# Patient Record
Sex: Female | Born: 1995 | Race: White | Hispanic: No | Marital: Single | Smoking: Never smoker
Health system: Southern US, Community
[De-identification: ages and names within clinical notes are randomized; demographics above are authoritative.]

## PROBLEM LIST (undated history)

## (undated) DIAGNOSIS — Z789 Other specified health status: Secondary | ICD-10-CM

## (undated) HISTORY — PX: NO PAST SURGERIES: SHX2092

---

## 2014-10-29 ENCOUNTER — Emergency Department: Admit: 2014-10-29 | Disposition: A | Discharge status: Home / Self Care | Payer: Self-pay | Admitting: Student

## 2014-10-29 LAB — URINALYSIS, COMPLETE
BILIRUBIN, UR: NEGATIVE
BLOOD: NEGATIVE
Glucose,UR: NEGATIVE mg/dL (ref 0–75)
Ketone: NEGATIVE
LEUKOCYTE ESTERASE: NEGATIVE
Nitrite: NEGATIVE
Ph: 6 (ref 4.5–8.0)
Protein: NEGATIVE
Specific Gravity: 1.025 (ref 1.003–1.030)

## 2014-10-29 LAB — CBC WITH DIFFERENTIAL/PLATELET
Basophil #: 0 10*3/uL (ref 0.0–0.1)
Basophil %: 0.4 %
EOS PCT: 0.9 %
Eosinophil #: 0.1 10*3/uL (ref 0.0–0.7)
HCT: 39.2 % (ref 35.0–47.0)
HGB: 13.3 g/dL (ref 12.0–16.0)
LYMPHS ABS: 2.7 10*3/uL (ref 1.0–3.6)
Lymphocyte %: 28.6 %
MCH: 29.9 pg (ref 26.0–34.0)
MCHC: 34 g/dL (ref 32.0–36.0)
MCV: 88 fL (ref 80–100)
MONO ABS: 0.5 x10 3/mm (ref 0.2–0.9)
MONOS PCT: 5 %
NEUTROS ABS: 6.1 10*3/uL (ref 1.4–6.5)
Neutrophil %: 65.1 %
Platelet: 233 10*3/uL (ref 150–440)
RBC: 4.46 10*6/uL (ref 3.80–5.20)
RDW: 12.2 % (ref 11.5–14.5)
WBC: 9.3 10*3/uL (ref 3.6–11.0)

## 2014-10-29 LAB — BASIC METABOLIC PANEL
ANION GAP: 5 — AB (ref 7–16)
BUN: 13 mg/dL
CALCIUM: 8.8 mg/dL — AB
CHLORIDE: 106 mmol/L
CREATININE: 0.78 mg/dL
Co2: 26 mmol/L
EGFR (African American): >60
Glucose: 105 mg/dL — ABNORMAL HIGH
Potassium: 3.8 mmol/L
Sodium: 137 mmol/L

## 2014-10-29 LAB — MONONUCLEOSIS SCREEN: Mono Test: NEGATIVE

## 2014-10-31 LAB — BETA STREP CULTURE(ARMC)

## 2014-11-02 ENCOUNTER — Emergency Department
Admit: 2014-11-02 | Disposition: A | Discharge status: Home / Self Care | Payer: Self-pay | Admitting: Emergency Medicine

## 2014-11-02 LAB — URINALYSIS, COMPLETE
BACTERIA: NONE SEEN
BLOOD: NEGATIVE
Bilirubin,UR: NEGATIVE
GLUCOSE, UR: NEGATIVE mg/dL (ref 0–75)
Ketone: NEGATIVE
LEUKOCYTE ESTERASE: NEGATIVE
NITRITE: NEGATIVE
PH: 7 (ref 4.5–8.0)
PROTEIN: NEGATIVE
Specific Gravity: 1.013 (ref 1.003–1.030)

## 2014-11-02 LAB — CBC WITH DIFFERENTIAL/PLATELET
Basophil #: 0 10*3/uL (ref 0.0–0.1)
Basophil %: 0.4 %
EOS PCT: 1.8 %
Eosinophil #: 0.2 10*3/uL (ref 0.0–0.7)
HCT: 36.9 % (ref 35.0–47.0)
HGB: 12.7 g/dL (ref 12.0–16.0)
LYMPHS ABS: 3.3 10*3/uL (ref 1.0–3.6)
Lymphocyte %: 36.7 %
MCH: 29.9 pg (ref 26.0–34.0)
MCHC: 34.3 g/dL (ref 32.0–36.0)
MCV: 87 fL (ref 80–100)
MONO ABS: 0.6 x10 3/mm (ref 0.2–0.9)
Monocyte %: 6.2 %
NEUTROS ABS: 4.9 10*3/uL (ref 1.4–6.5)
Neutrophil %: 54.9 %
PLATELETS: 268 10*3/uL (ref 150–440)
RBC: 4.24 10*6/uL (ref 3.80–5.20)
RDW: 11.8 % (ref 11.5–14.5)
WBC: 9 10*3/uL (ref 3.6–11.0)

## 2014-11-02 LAB — COMPREHENSIVE METABOLIC PANEL
ALBUMIN: 4.2 g/dL
ALK PHOS: 56 U/L
ANION GAP: 10 (ref 7–16)
BUN: 8 mg/dL
Bilirubin,Total: 0.3 mg/dL
CREATININE: 0.72 mg/dL
Calcium, Total: 9.5 mg/dL
Chloride: 109 mmol/L
Co2: 22 mmol/L
EGFR (African American): >60
EGFR (Non-African Amer.): >60
Glucose: 90 mg/dL
POTASSIUM: 3.6 mmol/L
SGOT(AST): 31 U/L
SGPT (ALT): 12 U/L — ABNORMAL LOW
SODIUM: 141 mmol/L
Total Protein: 7.5 g/dL

## 2014-11-04 LAB — CULTURE, BLOOD (SINGLE)

## 2015-10-02 ENCOUNTER — Inpatient Hospital Stay
Admission: EM | Admit: 2015-10-02 | Discharge: 2015-10-05 | DRG: 866 | Disposition: A | Discharge status: Home / Self Care | Payer: BLUE CROSS/BLUE SHIELD | Attending: Internal Medicine | Admitting: Internal Medicine

## 2015-10-02 ENCOUNTER — Encounter: Payer: Self-pay | Admitting: Emergency Medicine

## 2015-10-02 ENCOUNTER — Emergency Department: Payer: BLUE CROSS/BLUE SHIELD

## 2015-10-02 DIAGNOSIS — A90 Dengue fever [classical dengue]: Secondary | ICD-10-CM | POA: Diagnosis not present

## 2015-10-02 DIAGNOSIS — R51 Headache: Secondary | ICD-10-CM

## 2015-10-02 DIAGNOSIS — E876 Hypokalemia: Secondary | ICD-10-CM | POA: Diagnosis present

## 2015-10-02 DIAGNOSIS — R519 Headache, unspecified: Secondary | ICD-10-CM

## 2015-10-02 DIAGNOSIS — E861 Hypovolemia: Secondary | ICD-10-CM | POA: Diagnosis present

## 2015-10-02 DIAGNOSIS — R509 Fever, unspecified: Secondary | ICD-10-CM | POA: Diagnosis present

## 2015-10-02 DIAGNOSIS — M2550 Pain in unspecified joint: Secondary | ICD-10-CM | POA: Diagnosis present

## 2015-10-02 DIAGNOSIS — I959 Hypotension, unspecified: Secondary | ICD-10-CM | POA: Diagnosis present

## 2015-10-02 DIAGNOSIS — K920 Hematemesis: Secondary | ICD-10-CM | POA: Diagnosis present

## 2015-10-02 DIAGNOSIS — E860 Dehydration: Secondary | ICD-10-CM | POA: Diagnosis present

## 2015-10-02 DIAGNOSIS — Z20828 Contact with and (suspected) exposure to other viral communicable diseases: Secondary | ICD-10-CM | POA: Diagnosis present

## 2015-10-02 HISTORY — DX: Other specified health status: Z78.9

## 2015-10-02 LAB — CBC WITH DIFFERENTIAL/PLATELET
BASOS ABS: 0 10*3/uL (ref 0–0.1)
Basophils Relative: 0 %
Eosinophils Absolute: 0 10*3/uL (ref 0–0.7)
Eosinophils Relative: 0 %
HEMATOCRIT: 38.7 % (ref 35.0–47.0)
Hemoglobin: 13.4 g/dL (ref 12.0–16.0)
Lymphocytes Relative: 32 %
Lymphs Abs: 2.5 10*3/uL (ref 1.0–3.6)
MCH: 29.8 pg (ref 26.0–34.0)
MCHC: 34.7 g/dL (ref 32.0–36.0)
MCV: 85.8 fL (ref 80.0–100.0)
Monocytes Absolute: 0.5 10*3/uL (ref 0.2–0.9)
Monocytes Relative: 6 %
NEUTROS ABS: 4.9 10*3/uL (ref 1.4–6.5)
NEUTROS PCT: 62 %
Platelets: 209 10*3/uL (ref 150–440)
RBC: 4.51 MIL/uL (ref 3.80–5.20)
RDW: 12.9 % (ref 11.5–14.5)
WBC: 7.9 10*3/uL (ref 3.6–11.0)

## 2015-10-02 LAB — COMPREHENSIVE METABOLIC PANEL
ALBUMIN: 4 g/dL (ref 3.5–5.0)
ALT: 34 U/L (ref 14–54)
AST: 39 U/L (ref 15–41)
Alkaline Phosphatase: 61 U/L (ref 38–126)
Anion gap: 10 (ref 5–15)
BILIRUBIN TOTAL: 0.5 mg/dL (ref 0.3–1.2)
BUN: 9 mg/dL (ref 6–20)
CO2: 21 mmol/L — AB (ref 22–32)
Calcium: 8.6 mg/dL — ABNORMAL LOW (ref 8.9–10.3)
Chloride: 104 mmol/L (ref 101–111)
Creatinine, Ser: 0.76 mg/dL (ref 0.44–1.00)
GFR calc Af Amer: >60 mL/min (ref >60)
GFR calc non Af Amer: >60 mL/min (ref >60)
GLUCOSE: 78 mg/dL (ref 65–99)
Potassium: 3.1 mmol/L — ABNORMAL LOW (ref 3.5–5.1)
Sodium: 135 mmol/L (ref 135–145)
TOTAL PROTEIN: 7.5 g/dL (ref 6.5–8.1)

## 2015-10-02 LAB — URINALYSIS COMPLETE WITH MICROSCOPIC (ARMC ONLY)
Bilirubin Urine: NEGATIVE
GLUCOSE, UA: NEGATIVE mg/dL
Hgb urine dipstick: NEGATIVE
Leukocytes, UA: NEGATIVE
Nitrite: NEGATIVE
PH: 6 (ref 5.0–8.0)
Protein, ur: 30 mg/dL — AB
Specific Gravity, Urine: 1.019 (ref 1.005–1.030)

## 2015-10-02 LAB — APTT: aPTT: 37 seconds — ABNORMAL HIGH (ref 24–36)

## 2015-10-02 LAB — RAPID INFLUENZA A&B ANTIGENS (ARMC ONLY)
INFLUENZA A (ARMC): NEGATIVE
INFLUENZA B (ARMC): NEGATIVE

## 2015-10-02 LAB — PROTIME-INR
INR: 0.92
PROTHROMBIN TIME: 12.6 s (ref 11.4–15.0)

## 2015-10-02 MED ORDER — SODIUM CHLORIDE 0.9 % IV BOLUS (SEPSIS)
1000.0000 mL | Freq: Once | INTRAVENOUS | Status: AC
  Administered 2015-10-02: 1000 mL via INTRAVENOUS

## 2015-10-02 NOTE — ED Notes (Addendum)
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

## 2015-10-02 NOTE — ED Provider Notes (Signed)
Time Seen: Approximately 2217  I have reviewed the triage notes  Chief Complaint: Headache; Joint Pain; Fever; and Hematemesis   History of Present Illness: Kathryn Mcknight is a 20 y.o. female who presents with dad and recent onset of fever, body aches especially with bilateral knee joint pain, mild headache located behind both eyes and some vomiting of small amount of bright red blood. Patient has had exposure to dengue fever or recent trip to [place] and volunteered at a hospital. She states she returned yesterday but the fever started 2 days ago and restart high of 102 at home. She states she's also noticed some small amount of bleeding gums. She denies any melena or hematochezia. She denies any neck pain or stiffness. She denies any photophobia. She states she took required to prevent malaria. She is not aware of any mosquito bite but stated she certainly was in areas where the infection was prevalent. She states she last took Tylenol approximately 45 minutes prior to arrival.   History reviewed. No pertinent past medical history.  There are no active problems to display for this patient.   History reviewed. No pertinent past surgical history.  History reviewed. No pertinent past surgical history.  No current outpatient prescriptions on file.  Allergies:  Review of patient's allergies indicates no known allergies.  Family History: History reviewed. No pertinent family history.  Social History: Social History  Substance Use Topics  . Smoking status: Never Smoker   . Smokeless tobacco: None  . Alcohol Use: No     Review of Systems:   10 point review of systems was performed and was otherwise negative:  Constitutional: No fever Eyes: No visual disturbances ENT: No sore throat, ear pain Cardiac: No chest pain Respiratory: No shortness of breath, wheezing, or stridor Abdomen: No abdominal pain, no vomiting, No diarrhea Endocrine: No weight loss, No night  sweats Extremities: No peripheral edema, cyanosis Skin: No rashes, easy bruising Neurologic: No focal weakness, trouble with speech or swollowing Urologic: No dysuria, Hematuria, or urinary frequency  Physical Exam:  ED Triage Vitals  Enc Vitals Group     BP --      Pulse Rate 10/02/15 2131 121     Resp 10/02/15 2131 18     Temp 10/02/15 2131 98.4 F (36.9 C)     Temp Source 10/02/15 2131 Oral     SpO2 10/02/15 2131 100 %     Weight 10/02/15 2131 115 lb (52.164 kg)     Height 10/02/15 2131  (1.702 m)     Head Cir --      Peak Flow --      Pain Score 10/02/15 2141 6     Pain Loc --      Pain Edu? --      Excl. in GC? --     General: Awake , Alert , and Oriented times 3; GCS 15 Head: Normal cephalic , atraumatic Eyes: Pupils equal , round, reactive to light Nose/Throat: No nasal drainage, patent upper airway without erythema or exudate. I cannot visualize any obvious areas of bleeding Neck: Supple, Full range of motion, No anterior adenopathy or palpable thyroid masses. Patient has good flexion extension rotation without any pain or neuropraxia Lungs: Clear to ascultation without wheezes , rhonchi, or rales Heart: Regular rate, regular rhythm without murmurs , gallops , or rubs Abdomen: Soft, non tender without rebound, guarding , or rigidity; bowel sounds positive and symmetric in all 4 quadrants. No organomegaly .  Extremities: 2 plus symmetric pulses. No edema, clubbing or cyanosis Neurologic: normal ambulation, Motor symmetric without deficits, sensory intact Skin: warm, dry, no rashes   Labs:   All laboratory work was reviewed including any pertinent negatives or positives listed below:  Labs Reviewed  RAPID INFLUENZA A&B ANTIGENS ([facility] ONLY)  CULTURE, BLOOD (ROUTINE X 2)  CULTURE, BLOOD (ROUTINE X 2)  CBC WITH DIFFERENTIAL/PLATELET  COMPREHENSIVE METABOLIC PANEL  URINALYSIS COMPLETEWITH MICROSCOPIC ([facility] ONLY)  PROTIME-INR  APTT  Patient's  laboratory work at this point is returned as normal    Radiology:    DG Chest 2 View (Final result) Result time: 10/02/15 22:53:20   Final result by Rad Results In Interface (10/02/15 22:53:20)   Narrative:   CLINICAL DATA: Acute onset of fever, night sweats, joint pain, bleeding gums and hematemesis. Initial encounter.  EXAM: CHEST 2 VIEW  COMPARISON: None.  FINDINGS: The lungs are well-aerated and clear. There is no evidence of focal opacification, pleural effusion or pneumothorax.  The heart is normal in size; the mediastinal contour is within normal limits. No acute osseous abnormalities are seen.  IMPRESSION: No acute cardiopulmonary process seen.  Given the patient's symptoms, would correlate for any other evidence of dengue fever, severe dengue disease or other atypical infection.   Electronically Signed By: Roanna RaiderJeffery Chang M.D. On: 10/02/2015 22:53      I personally reviewed the radiologic studies    ED Course:  The patient's history seems consistent with dengue fever that is no physical findings at this time. Patient was established on a general fever workup with blood cultures 2 pending, influenza screening, etc. She does not appear to have any other source for infection right now. Patient's case was reviewed with the hospitalist team and IgM testing was ordered for "[facility]*. I felt the patient did not require spinal tap at this time for assessment for bacterial or viral meningitis. Take seems mild and she has no meningeal signs.    Assessment:  Possible dengue fever     Plan: * Inpatient management            Jennye MoccasinBrian S Laketia Vicknair, MD 10/03/15 [phone]

## 2015-10-03 ENCOUNTER — Inpatient Hospital Stay: Payer: BLUE CROSS/BLUE SHIELD

## 2015-10-03 ENCOUNTER — Encounter: Payer: Self-pay | Admitting: Internal Medicine

## 2015-10-03 DIAGNOSIS — E861 Hypovolemia: Secondary | ICD-10-CM | POA: Diagnosis present

## 2015-10-03 DIAGNOSIS — I959 Hypotension, unspecified: Secondary | ICD-10-CM | POA: Diagnosis present

## 2015-10-03 DIAGNOSIS — K920 Hematemesis: Secondary | ICD-10-CM | POA: Diagnosis present

## 2015-10-03 DIAGNOSIS — R509 Fever, unspecified: Secondary | ICD-10-CM | POA: Diagnosis present

## 2015-10-03 DIAGNOSIS — E860 Dehydration: Secondary | ICD-10-CM | POA: Diagnosis present

## 2015-10-03 DIAGNOSIS — M2550 Pain in unspecified joint: Secondary | ICD-10-CM | POA: Diagnosis present

## 2015-10-03 DIAGNOSIS — E876 Hypokalemia: Secondary | ICD-10-CM | POA: Diagnosis present

## 2015-10-03 DIAGNOSIS — A90 Dengue fever [classical dengue]: Secondary | ICD-10-CM | POA: Diagnosis present

## 2015-10-03 DIAGNOSIS — Z20828 Contact with and (suspected) exposure to other viral communicable diseases: Secondary | ICD-10-CM | POA: Diagnosis present

## 2015-10-03 LAB — CBC
HEMATOCRIT: 35.8 % (ref 35.0–47.0)
Hemoglobin: 12.3 g/dL (ref 12.0–16.0)
MCH: 30.1 pg (ref 26.0–34.0)
MCHC: 34.5 g/dL (ref 32.0–36.0)
MCV: 87.3 fL (ref 80.0–100.0)
Platelets: 188 10*3/uL (ref 150–440)
RBC: 4.09 MIL/uL (ref 3.80–5.20)
RDW: 13 % (ref 11.5–14.5)
WBC: 6.9 10*3/uL (ref 3.6–11.0)

## 2015-10-03 LAB — BASIC METABOLIC PANEL
ANION GAP: 5 (ref 5–15)
BUN: 8 mg/dL (ref 6–20)
CHLORIDE: 108 mmol/L (ref 101–111)
CO2: 22 mmol/L (ref 22–32)
Calcium: 7.9 mg/dL — ABNORMAL LOW (ref 8.9–10.3)
Creatinine, Ser: 0.77 mg/dL (ref 0.44–1.00)
Glucose, Bld: 79 mg/dL (ref 65–99)
POTASSIUM: 3.1 mmol/L — AB (ref 3.5–5.1)
SODIUM: 135 mmol/L (ref 135–145)

## 2015-10-03 LAB — INFLUENZA PANEL BY PCR (TYPE A & B)
H1N1FLUPCR: NOT DETECTED
INFLBPCR: NEGATIVE
Influenza A By PCR: NEGATIVE

## 2015-10-03 MED ORDER — ONDANSETRON HCL 4 MG/2ML IJ SOLN
4.0000 mg | Freq: Once | INTRAMUSCULAR | Status: AC
  Administered 2015-10-03: 4 mg via INTRAVENOUS

## 2015-10-03 MED ORDER — POTASSIUM CHLORIDE IN NACL 20-0.9 MEQ/L-% IV SOLN
INTRAVENOUS | Status: DC
  Administered 2015-10-03 – 2015-10-04 (×4): via INTRAVENOUS
  Filled 2015-10-03 (×7): qty 1000

## 2015-10-03 MED ORDER — MORPHINE SULFATE (PF) 2 MG/ML IV SOLN
2.0000 mg | INTRAVENOUS | Status: DC | PRN
  Administered 2015-10-03: 2 mg via INTRAVENOUS
  Filled 2015-10-03: qty 1

## 2015-10-03 MED ORDER — POTASSIUM CHLORIDE CRYS ER 20 MEQ PO TBCR
40.0000 meq | EXTENDED_RELEASE_TABLET | ORAL | Status: AC
  Administered 2015-10-03: 40 meq via ORAL
  Filled 2015-10-03: qty 2

## 2015-10-03 MED ORDER — ACETAMINOPHEN 650 MG RE SUPP: 650.0000 mg | Freq: Four times a day (QID) | RECTAL | Status: DC | PRN

## 2015-10-03 MED ORDER — ONDANSETRON HCL 4 MG/2ML IJ SOLN
4.0000 mg | Freq: Four times a day (QID) | INTRAMUSCULAR | Status: AC
  Administered 2015-10-03 – 2015-10-04 (×2): 4 mg via INTRAVENOUS
  Filled 2015-10-03: qty 2

## 2015-10-03 MED ORDER — ONDANSETRON HCL 4 MG/2ML IJ SOLN
INTRAMUSCULAR | Status: AC
  Filled 2015-10-03: qty 2

## 2015-10-03 MED ORDER — ONDANSETRON HCL 4 MG/2ML IJ SOLN
4.0000 mg | Freq: Four times a day (QID) | INTRAMUSCULAR | Status: DC | PRN
  Filled 2015-10-03: qty 2

## 2015-10-03 MED ORDER — ACETAMINOPHEN 325 MG PO TABS: 650.0000 mg | ORAL_TABLET | Freq: Four times a day (QID) | ORAL | Status: DC | PRN

## 2015-10-03 MED ORDER — SODIUM CHLORIDE 0.9 % IV BOLUS (SEPSIS)
1000.0000 mL | Freq: Once | INTRAVENOUS | Status: AC
  Administered 2015-10-03: 1000 mL via INTRAVENOUS

## 2015-10-03 MED ORDER — ONDANSETRON HCL 4 MG PO TABS
4.0000 mg | ORAL_TABLET | Freq: Four times a day (QID) | ORAL | Status: DC | PRN
  Administered 2015-10-04: 4 mg via ORAL
  Filled 2015-10-03 (×3): qty 1

## 2015-10-03 MED ORDER — OXYCODONE HCL 5 MG PO TABS
5.0000 mg | ORAL_TABLET | ORAL | Status: DC | PRN
  Administered 2015-10-03 (×2): 5 mg via ORAL
  Filled 2015-10-03 (×2): qty 1

## 2015-10-03 NOTE — Progress Notes (Signed)
pts bp remains low 70/50. Pt complains of "not feeling well at all" very sweaty and pain in joints and behind her eyes. Unable to give morphine at this time due to low bp. Notified md. Fluid bolus started.

## 2015-10-03 NOTE — Progress Notes (Signed)
[facility] [place] at [facility]   PATIENT NAME: Kathryn DunKathryn Almeda    MR#:  [id number]  DATE OF BIRTH:  1996-04-19  SUBJECTIVE:  CHIEF COMPLAINT:   Chief Complaint  Patient presents with  . Headache  . Joint Pain  . Fever  . Hematemesis   -Hypotensive this morning. No dizziness. -Feels miserable. Aching all over especially joint pains -No fevers today  REVIEW OF SYSTEMS:  Review of Systems  Constitutional: Positive for fever and malaise/fatigue. Negative for chills.  HENT: Negative for ear discharge, ear pain and nosebleeds.   Eyes: Negative for blurred vision, double vision and photophobia.  Respiratory: Negative for cough, shortness of breath and wheezing.   Cardiovascular: Negative for chest pain, palpitations and leg swelling.  Gastrointestinal: Positive for nausea. Negative for vomiting, abdominal pain, diarrhea and constipation.  Genitourinary: Negative for dysuria and urgency.  Musculoskeletal: Positive for myalgias and joint pain.       Arthraligias  Neurological: Positive for headaches. Negative for dizziness, speech change, focal weakness and seizures.  Psychiatric/Behavioral: Negative for depression.    DRUG ALLERGIES:  No Known Allergies  VITALS:  Blood pressure 82/50, pulse 90, temperature 98.8 F (37.1 C), temperature source Oral, resp. rate 16, height 5\' 7"  (1.702 m), weight 52.164 kg (115 lb), last menstrual period 09/22/2015, SpO2 100 %.  PHYSICAL EXAMINATION:  Physical Exam  GENERAL:  20 y.o.-year-old patient lying in the bed with no acute distress. Pale-appearing EYES: Pupils equal, round, reactive to light and accommodation. No scleral icterus. Extraocular muscles intact. No conjunctival erythema HEENT: Head atraumatic, normocephalic. Oropharynx and nasopharynx clear.  NECK:  Supple, no jugular venous distention. No thyroid enlargement, no tenderness.  LUNGS: Normal breath sounds bilaterally, no wheezing, rales,rhonchi  or crepitation. No use of accessory muscles of respiration.  CARDIOVASCULAR: S1, S2 normal. No murmurs, rubs, or gallops.  ABDOMEN: Soft, nontender, nondistended. Bowel sounds present. No organomegaly or mass.  EXTREMITIES: No pedal edema, cyanosis, or clubbing. Strong pulses present NEUROLOGIC: Cranial nerves II through XII are intact. Muscle strength 5/5 in all extremities. Sensation intact. Gait not checked.  PSYCHIATRIC: The patient is alert and oriented x 3.  SKIN: No obvious rash, lesion, or ulcer.    LABORATORY PANEL:   CBC  Recent Labs Lab 10/03/15 0317  WBC 6.9  HGB 12.3  HCT 35.8  PLT 188   ------------------------------------------------------------------------------------------------------------------  Chemistries   Recent Labs Lab 10/02/15 2154 10/03/15 0317  NA 135 135  K 3.1* 3.1*  CL 104 108  CO2 21* 22  GLUCOSE 78 79  BUN 9 8  CREATININE 0.76 0.77  CALCIUM 8.6* 7.9*  AST 39  --   ALT 34  --   ALKPHOS 61  --   BILITOT 0.5  --    ------------------------------------------------------------------------------------------------------------------  Cardiac Enzymes No results for input(s): TROPONINI in the last 168 hours. ------------------------------------------------------------------------------------------------------------------  RADIOLOGY:  Dg Chest 2 View  10/02/2015  CLINICAL DATA:  Acute onset of fever, night sweats, joint pain, bleeding gums and hematemesis. Initial encounter. EXAM: CHEST  2 VIEW COMPARISON:  None. FINDINGS: The lungs are well-aerated and clear. There is no evidence of focal opacification, pleural effusion or pneumothorax. The heart is normal in size; the mediastinal contour is within normal limits. No acute osseous abnormalities are seen. IMPRESSION: No acute cardiopulmonary process seen. Given the patient's symptoms, would correlate for any other evidence of dengue fever, severe dengue disease or other atypical infection.  Electronically Signed   By: Beryle BeamsJeffery  Chang M.D.  On: 10/02/2015 22:53    EKG:  No orders found for this or any previous visit.  ASSESSMENT AND PLAN:   An 20-year-old female with no past medical history, just returning from [place] admitted with bleeding gums, fevers and hematemesis  #1. Vector borne infection- possible dengue fever given symptoms - symptomatic trt, Hb, hct and plts counts are stable - no active bleeding now. Blood cultures, peripheral smear and serologies pending - fevers improved, ID consult pending - not on ABX for now - IV fluids, pain and nausea meds  #2 hypokalemia-being replaced  #3 nausea and hematemesis-started Zofran, scheduled for 2 days. Hematemesis have improved. Change diet to liquid diet for now  #4 DVT prophylaxis-Ted's and SCDs  #5 hypotension-likely hypovolemia-continue IV fluids. Received 2 boluses. Can receive up to 2 more boluses, if no further improvement, then will discuss with ICU about pressors. Strict intake and output monitoring   All the records are reviewed and case discussed with Care Management/Social Workerr. Management plans discussed with the patient, family and they are in agreement.  CODE STATUS: Full Code  TOTAL TIME TAKING CARE OF THIS PATIENT: 38 minutes.   POSSIBLE D/C IN 2 DAYS, DEPENDING ON CLINICAL CONDITION.   Enid Baas M.D on 10/03/2015 at 9:52 AM  Between 7am to 6pm - Pager - [phone]  After 6pm go to [email or web address] - password [email or web address] [facility]  [place] [facility]  Office  [phone]  CC: Primary care physician; No PCP Per Patient

## 2015-10-03 NOTE — H&P (Addendum)
[facility] [place] at [facility]   PATIENT NAME: Kathryn Mcknight [place]    MR#:  [id number]  DATE OF BIRTH:  October 17, 1995  DATE OF ADMISSION:  10/02/2015  PRIMARY CARE PHYSICIAN: No PCP Per Patient   REQUESTING/REFERRING PHYSICIAN: Huel CoteQuigley, MD  CHIEF COMPLAINT:   Chief Complaint  Patient presents with  . Headache  . Joint Pain  . Fever  . Hematemesis    HISTORY OF PRESENT ILLNESS:  Kathryn Mcknight Naas  is a 20 y.o. female who presents with Fever and bleeding from her gums and hematemesis. Patient just returned within the last 24 hours from a trip to [place]. 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 The day prior to returning home she developed a fever of 102. She has had persistent fever since that time, treated with Tylenol. She states that she was on chloroquine for malaria prophylaxis, and did not miss any doses there. She does not recall any mosquito bites and has no rash, however she does state that there were a lot of mosquitoes around. She complains of arthralgias, and states that her fever comes back persistently as soon as her Tylenol dose wears off. She developed bleeding from her gums and an episode of hematemesis within the last 24 hours. Here in the ED her initial lab workup was largely negative.  Hospitalists were called for admission and further evaluation given concern for vector borne viral or malarial infection.  PAST MEDICAL HISTORY:   Past Medical History  Diagnosis Date  . Patient denies medical problems     PAST SURGICAL HISTORY:   Past Surgical History  Procedure Laterality Date  . No past surgeries      SOCIAL HISTORY:   Social History  Substance Use Topics  . Smoking status: Never Smoker   . Smokeless tobacco: Not on file  . Alcohol Use: No    FAMILY HISTORY:  History reviewed. No pertinent family history. Patient states no significant family history DRUG ALLERGIES:  No Known Allergies  MEDICATIONS AT  HOME:   Prior to Admission medications   Medication Sig Start Date End Date Taking? Authorizing Provider  LORYNA 3-0.02 MG tablet Take 1 tablet by mouth daily.   Yes Historical Provider, MD    REVIEW OF SYSTEMS:  Review of Systems  Constitutional: Positive for fever and malaise/fatigue. Negative for chills and weight loss.  HENT: Negative for ear pain, hearing loss and tinnitus.   Eyes: Negative for blurred vision, double vision, pain and redness.  Respiratory: Negative for cough, hemoptysis and shortness of breath.   Cardiovascular: Negative for chest pain, palpitations, orthopnea and leg swelling.  Gastrointestinal: Positive for vomiting. Negative for nausea, abdominal pain, diarrhea and constipation.       Hematemesis  Genitourinary: Negative for dysuria, frequency and hematuria.  Musculoskeletal: Positive for joint pain. Negative for back pain and neck pain.  Skin:       No acne, rash, or lesions  Neurological: Negative for dizziness, tremors, focal weakness and weakness.  Endo/Heme/Allergies: Negative for polydipsia. Does not bruise/bleed easily.       Some bleeding from her gums  Psychiatric/Behavioral: Negative for depression. The patient is not nervous/anxious and does not have insomnia.      VITAL SIGNS:   Filed Vitals:   10/02/15 2131 10/02/15 2330 10/03/15 0000 10/03/15 0030  BP:  121/75 112/73 118/77  Pulse: 121 97 102 99  Temp: 98.4 F (36.9 C)     TempSrc: Oral  Resp: 18     Height:  (1.702 m)     Weight: 52.164 kg (115 lb)     SpO2: 100% 100% 100% 99%   Wt Readings from Last 3 Encounters:  10/02/15 52.164 kg (115 lb)    PHYSICAL EXAMINATION:  Physical Exam  Vitals reviewed. Constitutional: She is oriented to person, place, and time. She appears well-developed and well-nourished. No distress.  HENT:  Head: Normocephalic and atraumatic.  Dry mucous membranes  Eyes: Conjunctivae and EOM are normal. Pupils are equal, round, and reactive to light.  No scleral icterus.  Neck: Normal range of motion. Neck supple. No JVD present. No thyromegaly present.  Cardiovascular: Regular rhythm and intact distal pulses.  Exam reveals no gallop and no friction rub.   No murmur heard. Tachycardic  Respiratory: Effort normal and breath sounds normal. No respiratory distress. She has no wheezes. She has no rales.  GI: Soft. Bowel sounds are normal. She exhibits no distension. There is no tenderness.  No hepatosplenomegaly  Musculoskeletal: Normal range of motion. She exhibits no edema.  No arthritis, no gout  Lymphadenopathy:    She has no cervical adenopathy.  Neurological: She is alert and oriented to person, place, and time. No cranial nerve deficit.  No dysarthria, no aphasia  Skin: Skin is warm and dry. No rash noted. No erythema.  Psychiatric: She has a normal mood and affect. Her behavior is normal. Judgment and thought content normal.    LABORATORY PANEL:   CBC  Recent Labs Lab 10/02/15 2154  WBC 7.9  HGB 13.4  HCT 38.7  PLT 209   ------------------------------------------------------------------------------------------------------------------  Chemistries   Recent Labs Lab 10/02/15 2154  NA 135  K 3.1*  CL 104  CO2 21*  GLUCOSE 78  BUN 9  CREATININE 0.76  CALCIUM 8.6*  AST 39  ALT 34  ALKPHOS 61  BILITOT 0.5   ------------------------------------------------------------------------------------------------------------------  Cardiac Enzymes No results for input(s): TROPONINI in the last 168 hours. ------------------------------------------------------------------------------------------------------------------  RADIOLOGY:  Dg Chest 2 View  10/02/2015  CLINICAL DATA:  Acute onset of fever, night sweats, joint pain, bleeding gums and hematemesis. Initial encounter. EXAM: CHEST  2 VIEW COMPARISON:  None. FINDINGS: The lungs are well-aerated and clear. There is no evidence of focal opacification, pleural effusion or  pneumothorax. The heart is normal in size; the mediastinal contour is within normal limits. No acute osseous abnormalities are seen. IMPRESSION: No acute cardiopulmonary process seen. Given the patient's symptoms, would correlate for any other evidence of dengue fever, severe dengue disease or other atypical infection. Electronically Signed   By: Roanna Raider M.D.   On: 10/02/2015 22:53    EKG:  No orders found for this or any previous visit.  IMPRESSION AND PLAN:  Principal Problem:   Hematemesis - in conjunction with fever and arthralgias there is concern for possible dengue fever. Blood tests for the same sent from the ED, low-grade blood tests for chikungunya. However these are send out tests and is unknown how long they will take to return. Also ordered were blood smear for malaria and serology for Zika virus.  Patient's hemoglobin and hematocrit are stable, blood pressure stable. Other treatment will be supportive as below. Active Problems:   Fever - Tylenol reduces her fever effectively at this time. We will continue when necessary Tylenol for the same.   Arthralgia - given her hematemesis, we will avoid NSAIDs. Low-dose morphine for arthralgia not sufficiently treated with Tylenol.   Dehydration - 1 L  IV bolus given in the ED with resultant drop in her heart rate to the low 100s, we will order another liter bolus, and encourage by mouth intake of fluids.  All the records are reviewed and case discussed with ED provider. Management plans discussed with the patient and/or family.  DVT PROPHYLAXIS: Mechanical only  GI PROPHYLAXIS: None  ADMISSION STATUS: Inpatient  CODE STATUS: Full Code Status History    This patient does not have a recorded code status. Please follow your organizational policy for patients in this situation.      TOTAL TIME TAKING CARE OF THIS PATIENT: 40 minutes.    Ragen Laver FIELDING 10/03/2015, 2:07 AM  [facility]  Office   [phone]  CC: Primary care physician; No PCP Per Patient

## 2015-10-03 NOTE — Progress Notes (Signed)
Pt admitted to airborne isolation room 133, airborne precautions in place.

## 2015-10-03 NOTE — Progress Notes (Signed)
Sara wall notified me that pt has no need to be on isolation.  She did say that we have not done a Flu pcr.  Dr Nemiah CommanderKalisetti ordered flu pcr

## 2015-10-03 NOTE — Consult Note (Signed)
[facility]     Reason for Consult: Fever in returning traveller    Referring Physician:  Tressia Miners,  Date of Admission:  10/02/2015   Principal Problem:   Hematemesis Active Problems:   Fever   Arthralgia   Dehydration   HPI: Kathryn Mcknight is a 20 y.o. female admitted with bleeding gums and fever.   Patient just returned within the last 24 hours from a trip to [place]. Her fever and chills started in [place] day prior to returning. She had nv and eventually hematemesis. She also had HA, retro-orbital pain and pain in her knees.  She had received a course of azithromycin at that time in [place]. She had a few loose stools but not severe. No abd pain. Since admission has felt feverish but none noted on VS. She had extensive work up last year at [facility] after a similar event with trip to [place] and wu neg.    Past Medical History  Diagnosis Date  . Patient denies medical problems    Past Surgical History  Procedure Laterality Date  . No past surgeries     Social History  Substance Use Topics  . Smoking status: Never Smoker   . Smokeless tobacco: None  . Alcohol Use: No   History reviewed. No pertinent family history.  Allergies: No Known Allergies  Current antibiotics: Antibiotics Given (last 72 hours)    None      MEDICATIONS: . ondansetron (ZOFRAN) IV  4 mg Intravenous 4 times per day    Review of Systems - 11 systems reviewed and negative per HPI   OBJECTIVE: Temp:  [98.4 F (36.9 C)-99.8 F (37.7 C)] 98.8 F (37.1 C) (03/27 0817) Pulse Rate:  [90-121] 90 (03/27 0817) Resp:  [16-18] 16 (03/27 0817) BP: (70-121)/(42-77) 80/42 mmHg (03/27 1124) SpO2:  [99 %-100 %] 100 % (03/27 0817) Weight:  [52.164 kg (115 lb)] 52.164 kg (115 lb) (03/26 2131) Physical Exam  Constitutional:  oriented to person, place, and time. appears well-developed and well-nourished. No distress.  HENT: [place]/AT, PERRLA, no scleral icterus Mouth/Throat:  Oropharynx is clear and moist. No oropharyngeal exudate.  Cardiovascular: Normal rate, regular rhythm and normal heart sounds. Exam reveals no gallop and no friction rub.  No murmur heard.  Pulmonary/Chest: Effort normal and breath sounds normal. No respiratory distress.  has no wheezes.  Neck = supple, no nuchal rigidity Abdominal: Soft. Bowel sounds are normal.  exhibits no distension. There is no tenderness.  Lymphadenopathy: no cervical adenopathy. No axillary adenopathy Neurological: alert and oriented to person, place, and time.  Skin: Skin is warm and dry. No rash noted. No erythema.  Psychiatric: a normal mood and affect.  behavior is normal.    LABS: Results for orders placed or performed during the hospital encounter of 10/02/15 (from the past 48 hour(s))  CBC with Differential/Platelet     Status: None   Collection Time: 10/02/15  9:54 PM  Result Value Ref Range   WBC 7.9 3.6 - 11.0 K/uL   RBC 4.51 3.80 - 5.20 MIL/uL   Hemoglobin 13.4 12.0 - 16.0 g/dL   HCT 38.7 35.0 - 47.0 %   MCV 85.8 80.0 - 100.0 fL   MCH 29.8 26.0 - 34.0 pg   MCHC 34.7 32.0 - 36.0 g/dL   RDW 12.9 11.5 - 14.5 %   Platelets 209 150 - 440 K/uL   Neutrophils Relative % 62 %   Neutro Abs 4.9 1.4 - 6.5 K/uL   Lymphocytes Relative  32 %   Lymphs Abs 2.5 1.0 - 3.6 K/uL   Monocytes Relative 6 %   Monocytes Absolute 0.5 0.2 - 0.9 K/uL   Eosinophils Relative 0 %   Eosinophils Absolute 0.0 0 - 0.7 K/uL   Basophils Relative 0 %   Basophils Absolute 0.0 0 - 0.1 K/uL  Comprehensive metabolic panel     Status: Abnormal   Collection Time: 10/02/15  9:54 PM  Result Value Ref Range   Sodium 135 135 - 145 mmol/L   Potassium 3.1 (L) 3.5 - 5.1 mmol/L   Chloride 104 101 - 111 mmol/L   CO2 21 (L) 22 - 32 mmol/L   Glucose, Bld 78 65 - 99 mg/dL   BUN 9 6 - 20 mg/dL   Creatinine, Ser 0.76 0.44 - 1.00 mg/dL   Calcium 8.6 (L) 8.9 - 10.3 mg/dL   Total Protein 7.5 6.5 - 8.1 g/dL   Albumin 4.0 3.5 - 5.0 g/dL   AST 39  15 - 41 U/L   ALT 34 14 - 54 U/L   Alkaline Phosphatase 61 38 - 126 U/L   Total Bilirubin 0.5 0.3 - 1.2 mg/dL   GFR calc non Af Amer >60 >60 mL/min   GFR calc Af Amer >60 >60 mL/min    Comment: (NOTE) The eGFR has been calculated using the CKD EPI equation. This calculation has not been validated in all clinical situations. eGFR's persistently <60 mL/min signify possible Chronic Kidney Disease.    Anion gap 10 5 - 15  Culture, blood (Routine X 2) w Reflex to ID Panel     Status: None (Preliminary result)   Collection Time: 10/02/15  9:54 PM  Result Value Ref Range   Specimen Description BLOOD LEFT ASSIST CONTROL    Special Requests BOTTLES DRAWN AEROBIC AND ANAEROBIC 7CCAERO,4CCANA    Culture NO GROWTH < 12 HOURS    Report Status PENDING   Protime-INR     Status: None   Collection Time: 10/02/15  9:54 PM  Result Value Ref Range   Prothrombin Time 12.6 11.4 - 15.0 seconds   INR 0.92   APTT     Status: Abnormal   Collection Time: 10/02/15  9:54 PM  Result Value Ref Range   aPTT 37 (H) 24 - 36 seconds    Comment:        IF BASELINE aPTT IS ELEVATED, SUGGEST PATIENT RISK ASSESSMENT BE USED TO DETERMINE APPROPRIATE ANTICOAGULANT THERAPY.   Urinalysis complete, with microscopic ([facility] only)     Status: Abnormal   Collection Time: 10/02/15 10:21 PM  Result Value Ref Range   Color, Urine YELLOW (A) YELLOW   APPearance CLEAR (A) CLEAR   Glucose, UA NEGATIVE NEGATIVE mg/dL   Bilirubin Urine NEGATIVE NEGATIVE   Ketones, ur 2+ (A) NEGATIVE mg/dL   Specific Gravity, Urine 1.019 1.005 - 1.030   Hgb urine dipstick NEGATIVE NEGATIVE   pH 6.0 5.0 - 8.0   Protein, ur 30 (A) NEGATIVE mg/dL   Nitrite NEGATIVE NEGATIVE   Leukocytes, UA NEGATIVE NEGATIVE   RBC / HPF 0-5 0 - 5 RBC/hpf   WBC, UA 0-5 0 - 5 WBC/hpf   Bacteria, UA RARE (A) NONE SEEN   Squamous Epithelial / LPF 0-5 (A) NONE SEEN   Mucous PRESENT   Rapid Influenza A&B Antigens ([facility] only)     Status: None   Collection Time:  10/02/15 10:21 PM  Result Value Ref Range   Influenza A ([facility]) NEGATIVE NEGATIVE  Influenza B ([facility]) NEGATIVE NEGATIVE  Culture, blood (Routine X 2) w Reflex to ID Panel     Status: None (Preliminary result)   Collection Time: 10/02/15 10:21 PM  Result Value Ref Range   Specimen Description BLOOD RIGHT ASSIST CONTROL    Special Requests BOTTLES DRAWN AEROBIC AND ANAEROBIC 5CCAERO,3CCANA    Culture NO GROWTH < 12 HOURS    Report Status PENDING   Basic metabolic panel     Status: Abnormal   Collection Time: 10/03/15  3:17 AM  Result Value Ref Range   Sodium 135 135 - 145 mmol/L   Potassium 3.1 (L) 3.5 - 5.1 mmol/L   Chloride 108 101 - 111 mmol/L   CO2 22 22 - 32 mmol/L   Glucose, Bld 79 65 - 99 mg/dL   BUN 8 6 - 20 mg/dL   Creatinine, Ser 0.77 0.44 - 1.00 mg/dL   Calcium 7.9 (L) 8.9 - 10.3 mg/dL   GFR calc non Af Amer >60 >60 mL/min   GFR calc Af Amer >60 >60 mL/min    Comment: (NOTE) The eGFR has been calculated using the CKD EPI equation. This calculation has not been validated in all clinical situations. eGFR's persistently <60 mL/min signify possible Chronic Kidney Disease.    Anion gap 5 5 - 15  CBC     Status: None   Collection Time: 10/03/15  3:17 AM  Result Value Ref Range   WBC 6.9 3.6 - 11.0 K/uL   RBC 4.09 3.80 - 5.20 MIL/uL   Hemoglobin 12.3 12.0 - 16.0 g/dL   HCT 35.8 35.0 - 47.0 %   MCV 87.3 80.0 - 100.0 fL   MCH 30.1 26.0 - 34.0 pg   MCHC 34.5 32.0 - 36.0 g/dL   RDW 13.0 11.5 - 14.5 %   Platelets 188 150 - 440 K/uL   No components found for: ESR, C REACTIVE PROTEIN MICRO: Recent Results (from the past 720 hour(s))  Culture, blood (Routine X 2) w Reflex to ID Panel     Status: None (Preliminary result)   Collection Time: 10/02/15  9:54 PM  Result Value Ref Range Status   Specimen Description BLOOD LEFT ASSIST CONTROL  Final   Special Requests BOTTLES DRAWN AEROBIC AND ANAEROBIC 7CCAERO,4CCANA  Final   Culture NO GROWTH < 12 HOURS  Final   Report  Status PENDING  Incomplete  Rapid Influenza A&B Antigens ([place] only)     Status: None   Collection Time: 10/02/15 10:21 PM  Result Value Ref Range Status   Influenza A ([place]) NEGATIVE NEGATIVE Final   Influenza B ([facility]) NEGATIVE NEGATIVE Final  Culture, blood (Routine X 2) w Reflex to ID Panel     Status: None (Preliminary result)   Collection Time: 10/02/15 10:21 PM  Result Value Ref Range Status   Specimen Description BLOOD RIGHT ASSIST CONTROL  Final   Special Requests BOTTLES DRAWN AEROBIC AND ANAEROBIC 5CCAERO,3CCANA  Final   Culture NO GROWTH < 12 HOURS  Final   Report Status PENDING  Incomplete    IMAGING: Dg Chest 2 View  10/02/2015  CLINICAL DATA:  Acute onset of fever, night sweats, joint pain, bleeding gums and hematemesis. Initial encounter. EXAM: CHEST  2 VIEW COMPARISON:  None. FINDINGS: The lungs are well-aerated and clear. There is no evidence of focal opacification, pleural effusion or pneumothorax. The heart is normal in size; the mediastinal contour is within normal limits. No acute osseous abnormalities are seen. IMPRESSION: No acute cardiopulmonary process seen.  Given the patient's symptoms, would correlate for any other evidence of dengue fever, severe dengue disease or other atypical infection. Electronically Signed   By: Garald Balding M.D.   On: 10/02/2015 22:53   Ct Head Wo Contrast  10/03/2015  CLINICAL DATA:  Three-day history of frontal headaches and pressure sensation behind each eye EXAM: CT HEAD WITHOUT CONTRAST TECHNIQUE: Contiguous axial images were obtained from the base of the skull through the vertex without intravenous contrast. COMPARISON:  None. FINDINGS: The ventricles are normal in size and configuration. There is no intracranial mass, hemorrhage, extra-axial fluid collection, or midline shift. Gray-white compartments are normal. There is no acute infarct evident. Bony calvarium appears intact. The visualized mastoid air cells are clear. Visualized orbits  appear symmetric bilaterally. IMPRESSION: Study within normal limits. Electronically Signed   By: Lowella Grip III M.D.   On: 10/03/2015 10:59    Assessment:   Kathryn Mcknight is a 20 y.o. female with fevers, chills, NV, mild hematemesis and HA, retroorbital pain following trip to [place]. Was not on malaria ppx but did take mosquito precautions. She has had vaccines for Hep A and B but no typhoid vaccine.  Diff dx includes flu, Dengue, malaria, chikungunya, Zika.  Clinically she has no fevers, HA 90s but BP low. CBC, LFTs and renal fx stable. UA negative CXR negative. CT head negative.  No rash.  BCX negative She has been on no abx since admit but did have azithro prior to admission 500 mg x 3 days  Recommendations -  Check malaria smear I have contacted [facility] regarding sending serological testing for dengue, Chikungunya and zika Continue symptomatic management If stable can be dced off abx with fu in 1-2 weeks  Thank you very much for allowing me to participate in the care of this patient. Please call with questions.   Cheral Marker. Ola Spurr, MD

## 2015-10-03 NOTE — Progress Notes (Signed)
Dr. Sheryle Hailiamond notified of pt blood pressure 87/52. Orders to be placed by MD.

## 2015-10-04 LAB — COMPREHENSIVE METABOLIC PANEL
ALBUMIN: 3.5 g/dL (ref 3.5–5.0)
ALT: 27 U/L (ref 14–54)
AST: 31 U/L (ref 15–41)
Alkaline Phosphatase: 59 U/L (ref 38–126)
Anion gap: 2 — ABNORMAL LOW (ref 5–15)
BUN: 6 mg/dL (ref 6–20)
CHLORIDE: 113 mmol/L — AB (ref 101–111)
CO2: 25 mmol/L (ref 22–32)
CREATININE: 0.63 mg/dL (ref 0.44–1.00)
Calcium: 8.7 mg/dL — ABNORMAL LOW (ref 8.9–10.3)
GFR calc Af Amer: >60 mL/min (ref >60)
GFR calc non Af Amer: >60 mL/min (ref >60)
GLUCOSE: 83 mg/dL (ref 65–99)
Potassium: 4 mmol/L (ref 3.5–5.1)
SODIUM: 140 mmol/L (ref 135–145)
Total Bilirubin: 0.3 mg/dL (ref 0.3–1.2)
Total Protein: 7 g/dL (ref 6.5–8.1)

## 2015-10-04 LAB — CBC
HCT: 38.9 % (ref 35.0–47.0)
HEMOGLOBIN: 13.3 g/dL (ref 12.0–16.0)
MCH: 30 pg (ref 26.0–34.0)
MCHC: 34.1 g/dL (ref 32.0–36.0)
MCV: 87.9 fL (ref 80.0–100.0)
PLATELETS: 200 10*3/uL (ref 150–440)
RBC: 4.42 MIL/uL (ref 3.80–5.20)
RDW: 13.2 % (ref 11.5–14.5)
WBC: 6.4 10*3/uL (ref 3.6–11.0)

## 2015-10-04 NOTE — Progress Notes (Signed)
Patient tolerating regular diet well. No complain of nausea or vomiting.

## 2015-10-04 NOTE — Progress Notes (Signed)
[facility] [place] [facility]   PATIENT NAME: Kathryn DunKathryn Mcknight    MR#:  [id number]  DATE OF BIRTH:  July 02, 1996  SUBJECTIVE:  CHIEF COMPLAINT:   Chief Complaint  Patient presents with  . Headache  . Joint Pain  . Fever  . Hematemesis   - Feels much better today, no fevers, still has bilateral knee pain and some nausea -Headache is better. Appears more alert and interactive  REVIEW OF SYSTEMS:  Review of Systems  Constitutional: Negative for fever, chills and malaise/fatigue.  HENT: Negative for ear discharge, ear pain and nosebleeds.   Eyes: Negative for blurred vision, double vision and photophobia.  Respiratory: Negative for cough, shortness of breath and wheezing.   Cardiovascular: Negative for chest pain, palpitations and leg swelling.  Gastrointestinal: Positive for nausea. Negative for vomiting, abdominal pain, diarrhea and constipation.  Genitourinary: Negative for dysuria and urgency.  Musculoskeletal: Positive for myalgias and joint pain.       Arthraligias   Neurological: Negative for dizziness, speech change, focal weakness, seizures and headaches.  Psychiatric/Behavioral: Negative for depression.    DRUG ALLERGIES:  No Known Allergies  VITALS:  Blood pressure 85/52, pulse 80, temperature 97.7 F (36.5 C), temperature source Oral, resp. rate 17, height 5\' 7"  (1.702 m), weight 52.164 kg (115 lb), last menstrual period 09/22/2015, SpO2 100 %.  PHYSICAL EXAMINATION:  Physical Exam  GENERAL:  20 y.o.-year-old patient lying in the bed with no acute distress.  EYES: Pupils equal, round, reactive to light and accommodation. No scleral icterus. Extraocular muscles intact. No conjunctival erythema HEENT: Head atraumatic, normocephalic. Oropharynx and nasopharynx clear.  NECK:  Supple, no jugular venous distention. No thyroid enlargement, no tenderness.  LUNGS: Normal breath sounds bilaterally, no wheezing, rales,rhonchi or  crepitation. No use of accessory muscles of respiration.  CARDIOVASCULAR: S1, S2 normal. No murmurs, rubs, or gallops.  ABDOMEN: Soft, nontender, nondistended. Bowel sounds present. No organomegaly or mass.  EXTREMITIES: No pedal edema, cyanosis, or clubbing. Strong pulses present NEUROLOGIC: Cranial nerves II through XII are intact. Muscle strength 5/5 in all extremities. Sensation intact. Gait not checked.  PSYCHIATRIC: The patient is alert and oriented x 3.  SKIN: No obvious rash, lesion, or ulcer.    LABORATORY PANEL:   CBC  Recent Labs Lab 10/04/15 0420  WBC 6.4  HGB 13.3  HCT 38.9  PLT 200   ------------------------------------------------------------------------------------------------------------------  Chemistries   Recent Labs Lab 10/04/15 0420  NA 140  K 4.0  CL 113*  CO2 25  GLUCOSE 83  BUN 6  CREATININE 0.63  CALCIUM 8.7*  AST 31  ALT 27  ALKPHOS 59  BILITOT 0.3   ------------------------------------------------------------------------------------------------------------------  Cardiac Enzymes No results for input(s): TROPONINI in the last 168 hours. ------------------------------------------------------------------------------------------------------------------  RADIOLOGY:  Dg Chest 2 View  10/02/2015  CLINICAL DATA:  Acute onset of fever, night sweats, joint pain, bleeding gums and hematemesis. Initial encounter. EXAM: CHEST  2 VIEW COMPARISON:  None. FINDINGS: The lungs are well-aerated and clear. There is no evidence of focal opacification, pleural effusion or pneumothorax. The heart is normal in size; the mediastinal contour is within normal limits. No acute osseous abnormalities are seen. IMPRESSION: No acute cardiopulmonary process seen. Given the patient's symptoms, would correlate for any other evidence of dengue fever, severe dengue disease or other atypical infection. Electronically Signed   By: Roanna RaiderJeffery  Chang M.D.   On: 10/02/2015 22:53    Ct Head Wo Contrast  10/03/2015  CLINICAL DATA:  Three-day  history of frontal headaches and pressure sensation behind each eye EXAM: CT HEAD WITHOUT CONTRAST TECHNIQUE: Contiguous axial images were obtained from the base of the skull through the vertex without intravenous contrast. COMPARISON:  None. FINDINGS: The ventricles are normal in size and configuration. There is no intracranial mass, hemorrhage, extra-axial fluid collection, or midline shift. Gray-white compartments are normal. There is no acute infarct evident. Bony calvarium appears intact. The visualized mastoid air cells are clear. Visualized orbits appear symmetric bilaterally. IMPRESSION: Study within normal limits. Electronically Signed   By: Bretta Bang III M.D.   On: 10/03/2015 10:59    EKG:  No orders found for this or any previous visit.  ASSESSMENT AND PLAN:   20 year old female with no past medical history, just returning from [place] admitted with bleeding gums, fevers and hematemesis  #1. 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 Blood cultures, peripheral smear and serologies for dengue, chikungunya, zika and peripheral smear for malaria pending - fevers improved, ID consult appreciated - not on ABX for now - IV fluids, pain and nausea meds- conservative management   #2 hypokalemia-replaced  #3 nausea and hematemesis-started Zofran, scheduled for 2 days. Hematemesis have improved. Advance diet  #4 DVT prophylaxis-Ted's and SCDs  #5 hypotension-likely hypovolemia-continue IV fluids. Asymptomatic. Continue to monitor.  If continues to remain stable, possible discharge tomorrow  All the records are reviewed and case discussed with Care Management/Social Workerr. Management plans discussed with the patient, family and they are in agreement.  CODE STATUS: Full Code  TOTAL TIME  TAKING CARE OF THIS PATIENT: 38 minutes.   POSSIBLE D/C TOMORROW, DEPENDING ON CLINICAL CONDITION.   Leilanee Righetti M.D on 10/04/2015 at 1:32 PM  Between 7am to 6pm - Pager - [phone]  After 6pm go to [email or web address] - password [email or web address] [facility]  [place] [facility]  Office  [phone]  CC: Primary care physician; No PCP Per Patient

## 2015-10-05 LAB — MISC LABCORP TEST (SEND OUT): LABCORP TEST CODE: 163049

## 2015-10-05 LAB — PARASITE EXAM, BLOOD

## 2015-10-05 NOTE — Discharge Summary (Signed)
[facility] [place] [facility]   PATIENT NAME: Kathryn DunKathryn Mcknight    MR#:  [id number]  DATE OF BIRTH:  April 26, 1996  DATE OF ADMISSION:  10/02/2015 ADMITTING PHYSICIAN: Oralia Manisavid Willis, MD  DATE OF DISCHARGE: 3//29/2017  PRIMARY CARE PHYSICIAN: No PCP Per Patient    ADMISSION DIAGNOSIS:  Dengue fever [A90]  DISCHARGE DIAGNOSIS:  Principal Problem:   Hematemesis Active Problems:   Fever   Arthralgia   Dehydration   SECONDARY DIAGNOSIS:   Past Medical History  Diagnosis Date  . Patient denies medical problems     HOSPITAL COURSE:   [age] year old female with no past medical history, just returning from [place] admitted with bleeding gums, fevers and hematemesis  #1. 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 Blood cultures are negative, peripheral smear and serologies for dengue, chikungunya, zika and peripheral smear for malaria pending - fevers improved, ID consult appreciated - not on ABX for now - Much improved with conservative management  - Feels better and denies any nausea/vomiting/body aches today- tolerating regular diet well  #2 hypokalemia-replaced  #3 hypotension-likely hypovolemia-Improved with IV fluids. Asymptomatic.   Feels much better and stable for discharge today  DISCHARGE CONDITIONS:   Stable  CONSULTS OBTAINED:  Treatment Team:  Mick Sellavid P Fitzgerald, MD  DRUG ALLERGIES:  No Known Allergies  DISCHARGE MEDICATIONS:   Current Discharge Medication List    CONTINUE these medications which have NOT CHANGED   Details  LORYNA 3-0.02 MG tablet Take 1 tablet by mouth daily.         DISCHARGE INSTRUCTIONS:    1. PCP f/u in 1-2 weeks   If you experience worsening of your admission symptoms, develop shortness of breath, life threatening emergency, suicidal or homicidal thoughts you must seek  medical attention immediately by calling 911 or calling your MD immediately  if symptoms less severe.  You Must read complete instructions/literature along with all the possible adverse reactions/side effects for all the Medicines you take and that have been prescribed to you. Take any new Medicines after you have completely understood and accept all the possible adverse reactions/side effects.   Please note  You were cared for by a hospitalist during your hospital stay. If you have any questions about your discharge medications or the care you received while you were in the hospital after you are discharged, you can call the unit and asked to speak with the hospitalist on call if the hospitalist that took care of you is not available. Once you are discharged, your primary care physician will handle any further medical issues. Please note that NO REFILLS for any discharge medications will be authorized once you are discharged, as it is imperative that you return to your primary care physician (or establish a relationship with a primary care physician if you do not have one) for your aftercare needs so that they can reassess your need for medications and monitor your lab values.    Today   CHIEF COMPLAINT:   Chief Complaint  Patient presents with  . Headache  . Joint Pain  . Fever  . Hematemesis     VITAL SIGNS:  Blood pressure 90/52, pulse 90, temperature 97.6 F (36.4 C), temperature source Oral, resp. rate 18, height 5\' 7"  (1.702 m), weight 52.164 kg (115 lb), last menstrual period 09/22/2015, SpO2 92 %.  I/O:   Intake/Output Summary (Last  24 hours) at 10/05/15 0918 Last data filed at 10/04/15 1700  Gross per 24 hour  Intake 1268.34 ml  Output    400 ml  Net 868.34 ml    PHYSICAL EXAMINATION:   Physical Exam   GENERAL: 20 y.o.-year-old patient lying in the bed with no acute distress.  EYES: Pupils equal, round, reactive to light and accommodation. No scleral icterus.  Extraocular muscles intact. No conjunctival erythema HEENT: Head atraumatic, normocephalic. Oropharynx and nasopharynx clear.  NECK: Supple, no jugular venous distention. No thyroid enlargement, no tenderness.  LUNGS: Normal breath sounds bilaterally, no wheezing, rales,rhonchi or crepitation. No use of accessory muscles of respiration.  CARDIOVASCULAR: S1, S2 normal. No murmurs, rubs, or gallops.  ABDOMEN: Soft, nontender, nondistended. Bowel sounds present. No organomegaly or mass.  EXTREMITIES: No pedal edema, cyanosis, or clubbing. Strong pulses present NEUROLOGIC: Cranial nerves II through XII are intact. Muscle strength 5/5 in all extremities. Sensation intact. Gait not checked.  PSYCHIATRIC: The patient is alert and oriented x 3.  SKIN: No obvious rash, lesion, or ulcer.   DATA REVIEW:   CBC  Recent Labs Lab 10/04/15 0420  WBC 6.4  HGB 13.3  HCT 38.9  PLT 200    Chemistries   Recent Labs Lab 10/04/15 0420  NA 140  K 4.0  CL 113*  CO2 25  GLUCOSE 83  BUN 6  CREATININE 0.63  CALCIUM 8.7*  AST 31  ALT 27  ALKPHOS 59  BILITOT 0.3    Cardiac Enzymes No results for input(s): TROPONINI in the last 168 hours.  Microbiology Results  Results for orders placed or performed during the hospital encounter of 10/02/15  Culture, blood (Routine X 2) w Reflex to ID Panel     Status: None (Preliminary result)   Collection Time: 10/02/15  9:54 PM  Result Value Ref Range Status   Specimen Description BLOOD LEFT ASSIST CONTROL  Final   Special Requests BOTTLES DRAWN AEROBIC AND ANAEROBIC 7CCAERO,4CCANA  Final   Culture NO GROWTH 2 DAYS  Final   Report Status PENDING  Incomplete  Rapid Influenza A&B Antigens ([facility] only)     Status: None   Collection Time: 10/02/15 10:21 PM  Result Value Ref Range Status   Influenza A ([facility]) NEGATIVE NEGATIVE Final   Influenza B ([facility]) NEGATIVE NEGATIVE Final  Culture, blood (Routine X 2) w Reflex to ID Panel     Status: None  (Preliminary result)   Collection Time: 10/02/15 10:21 PM  Result Value Ref Range Status   Specimen Description BLOOD RIGHT ASSIST CONTROL  Final   Special Requests BOTTLES DRAWN AEROBIC AND ANAEROBIC 5CCAERO,3CCANA  Final   Culture NO GROWTH 2 DAYS  Final   Report Status PENDING  Incomplete    RADIOLOGY:  Ct Head Wo Contrast  10/03/2015  CLINICAL DATA:  Three-day history of frontal headaches and pressure sensation behind each eye EXAM: CT HEAD WITHOUT CONTRAST TECHNIQUE: Contiguous axial images were obtained from the base of the skull through the vertex without intravenous contrast. COMPARISON:  None. FINDINGS: The ventricles are normal in size and configuration. There is no intracranial mass, hemorrhage, extra-axial fluid collection, or midline shift. Gray-white compartments are normal. There is no acute infarct evident. Bony calvarium appears intact. The visualized mastoid air cells are clear. Visualized orbits appear symmetric bilaterally. IMPRESSION: Study within normal limits. Electronically Signed   By: Bretta Bang III M.D.   On: 10/03/2015 10:59    EKG:  No orders found for this or any  previous visit.    Management plans discussed with the patient, family and they are in agreement.  CODE STATUS:     Code Status Orders        Start     Ordered   10/03/15 0216  Full code   Continuous     10/03/15 0216    Code Status History    Date Active Date Inactive Code Status Order ID Comments User Context   This patient has a current code status but no historical code status.      TOTAL TIME TAKING CARE OF THIS PATIENT:  .    Enid Baas M.D on 10/05/2015 at 9:18 AM  Between 7am to 6pm - Pager - [phone]  After 6pm go to [email or web address] - password [email or web address] [facility]  [facility]  Office  [phone]  CC: Primary care physician; No PCP Per Patient

## 2015-10-05 NOTE — Progress Notes (Signed)
Patient discharge via wheel chair

## 2015-10-05 NOTE — Progress Notes (Signed)
     Kathryn Mcknight was admitted to the [facility] on 10/02/2015 for an acute medical condition and is being Discharged on  10/05/2015 . She is still recovering and will need another 3-4 days for recovery and so advised to stay away from school until then. So please excuse her from school for the above mentioned  Days. Should be able to return to school without any restrictions from 10/10/15.  Call Enid Baasadhika Len [name]  MD, [facility] at  [phone] with questions.  [name],[name] M.D on 10/05/2015,at 9:17 AM  [facility] [address], [place] [address] Ph: [phone]

## 2015-10-05 NOTE — Progress Notes (Signed)
Order to discharge to home today, discharge instructions given per order, home medication reviewed,  Patient voiced understanding of discharge instructions given.  School note given to patient.

## 2015-10-06 LAB — ZIKA VIRUS NAA COMPREHENSIVE
ZIKA VIRUS, NAA, SERUM: NEGATIVE
ZIKA VIRUS, NAA, URINE: NEGATIVE

## 2015-10-07 LAB — MISC LABCORP TEST (SEND OUT)
LABCORP TEST CODE: 817245
Labcorp test code: 819198

## 2015-10-07 LAB — CULTURE, BLOOD (ROUTINE X 2)
CULTURE: NO GROWTH
Culture: NO GROWTH

## 2015-10-10 ENCOUNTER — Encounter: Payer: Self-pay | Admitting: Infectious Diseases

## 2015-10-10 LAB — PATHOLOGIST SMEAR REVIEW

## 2016-12-03 IMAGING — CR DG CHEST 2V
2 series · 2 of 2 positions shown · non-contrast
Comparison: None.

CLINICAL DATA: Acute onset of fever, night sweats, joint pain,
bleeding gums and hematemesis. Initial encounter.

EXAM:
CHEST  2 VIEW

[chest pa]
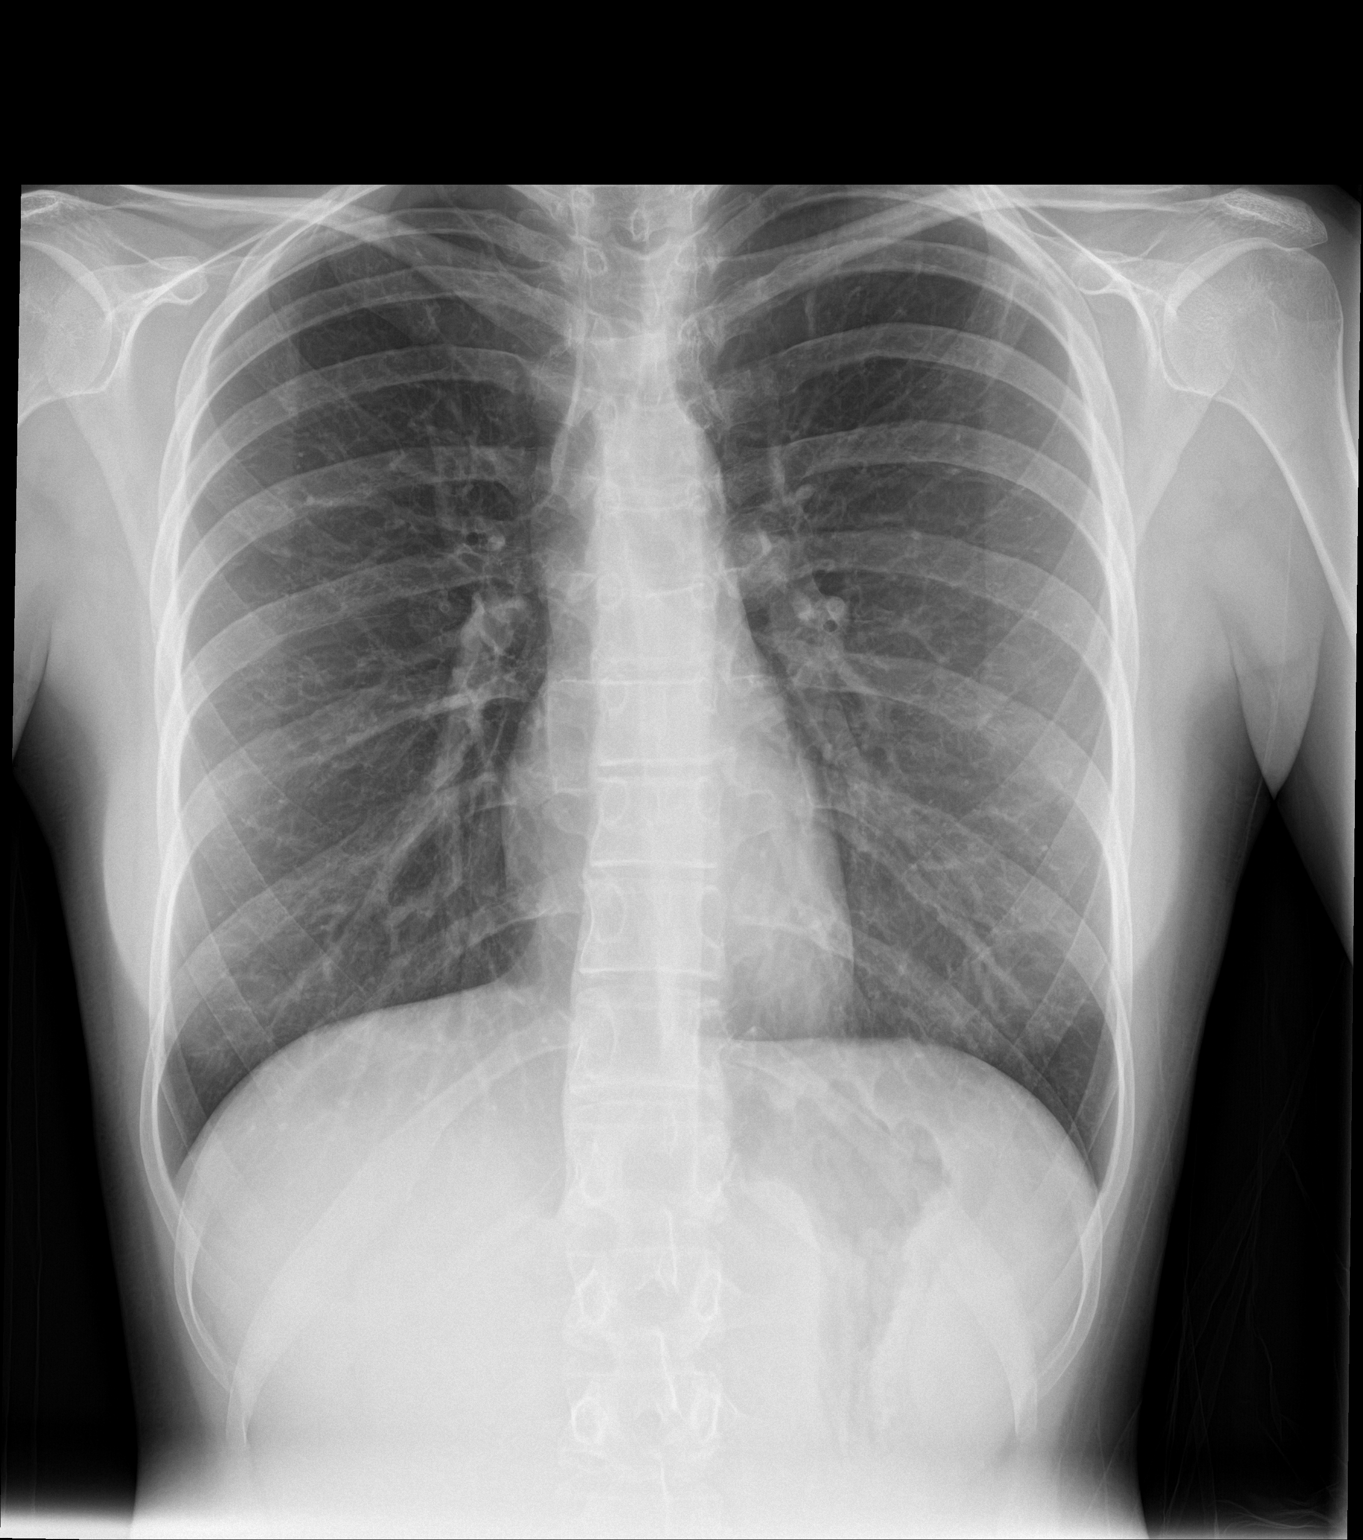

[chest lat]
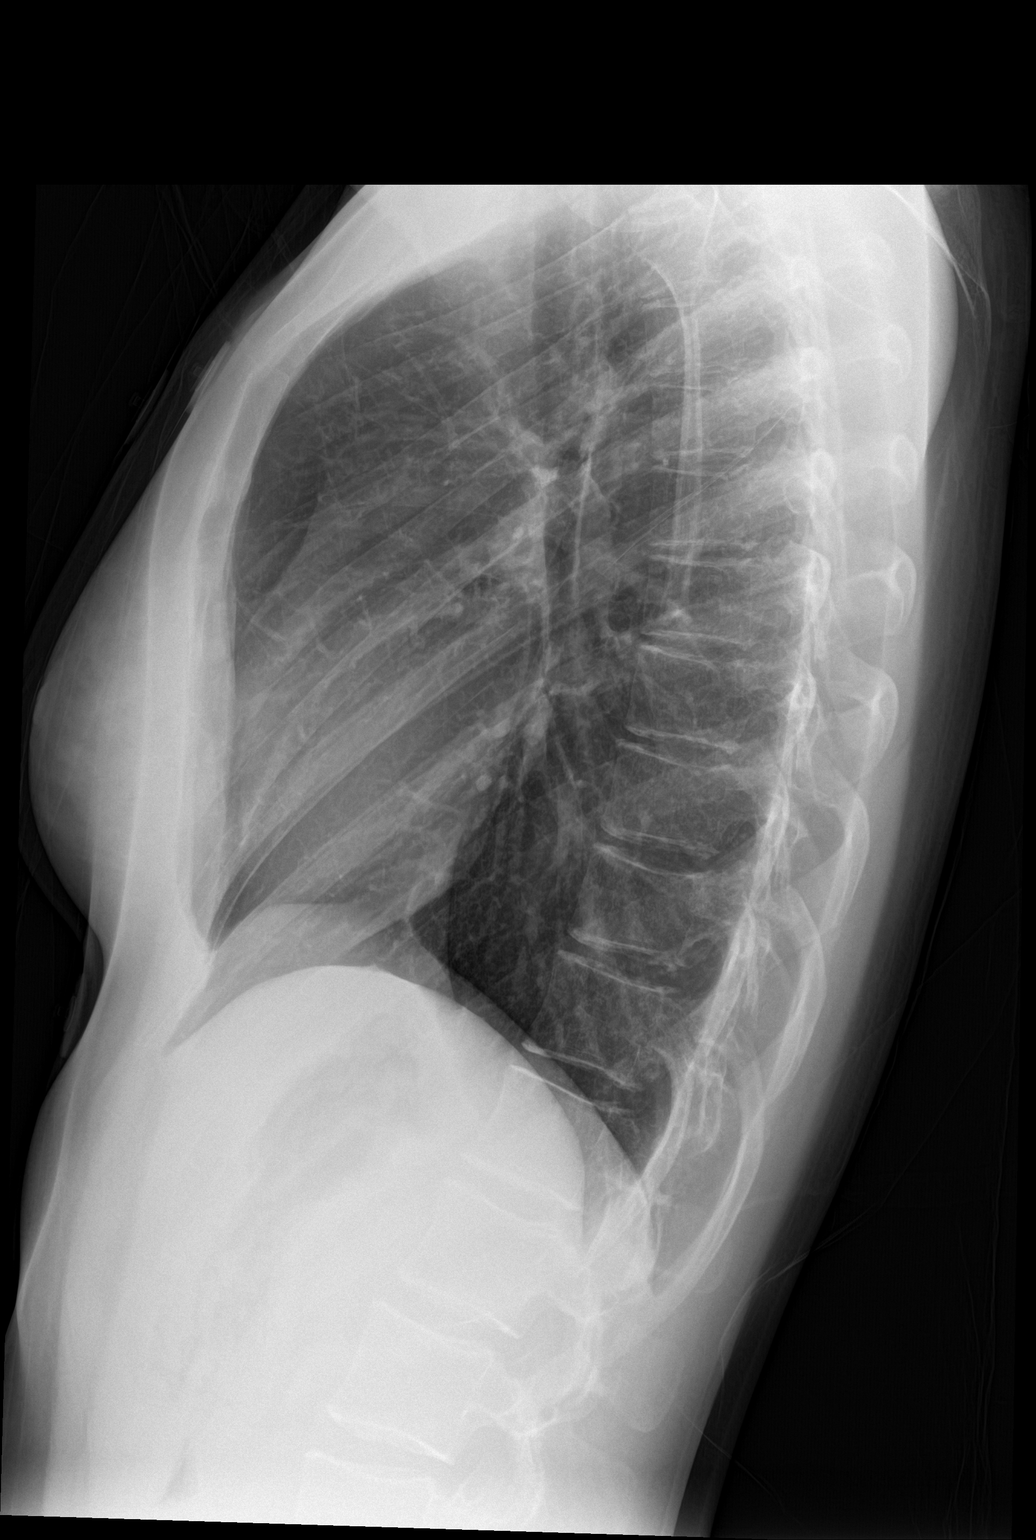

[2 of 2 positions shown; findings below may reference images not displayed]

FINDINGS: The lungs are well-aerated and clear. There is no evidence of focal
opacification, pleural effusion or pneumothorax.

The heart is normal in size; the mediastinal contour is within
normal limits. No acute osseous abnormalities are seen.
IMPRESSION: No acute cardiopulmonary process seen.

Given the patient's symptoms, would correlate for any other evidence
of dengue fever, severe dengue disease or other atypical infection.

## 2016-12-04 IMAGING — CT CT HEAD W/O CM
1 series · 16 of 27 positions shown, 20 images · non-contrast
Comparison: None.

CLINICAL DATA: Three-day history of frontal headaches and pressure
sensation behind each eye

EXAM:
CT HEAD WITHOUT CONTRAST
TECHNIQUE: Contiguous axial images were obtained from the base of the skull
through the vertex without intravenous contrast.

[Series 2: soft tissue · axial · 0.42mm/px · z∈[-112,+8]mm · 16 of 27 slices shown, 20 images]
[im 2/27  brain]
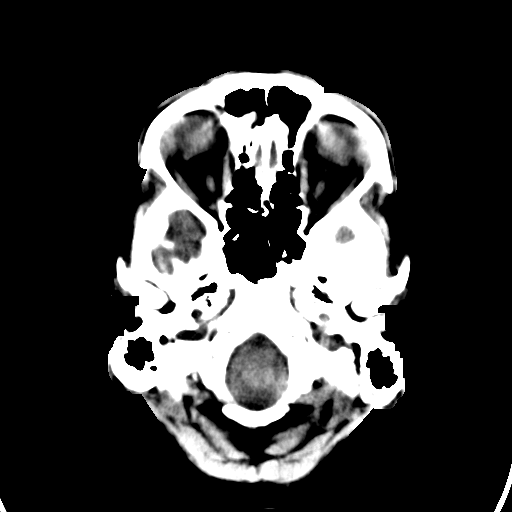
[im 2/27  bone]
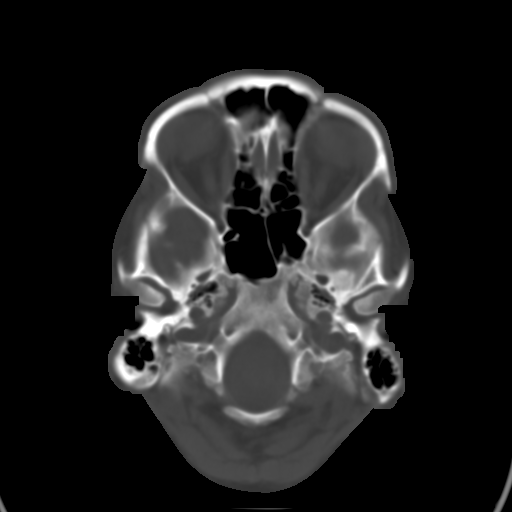
[im 4/27  brain]
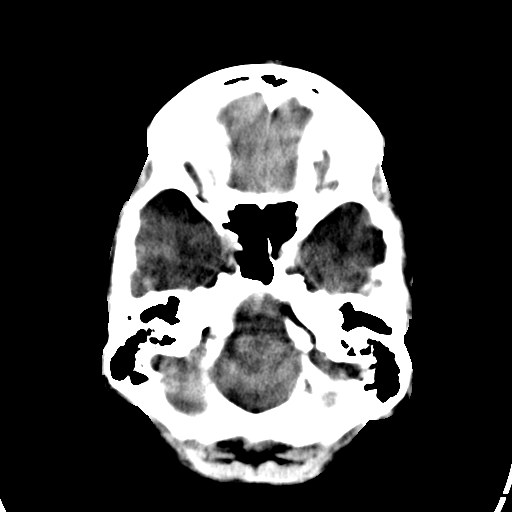
[im 5/27  brain]
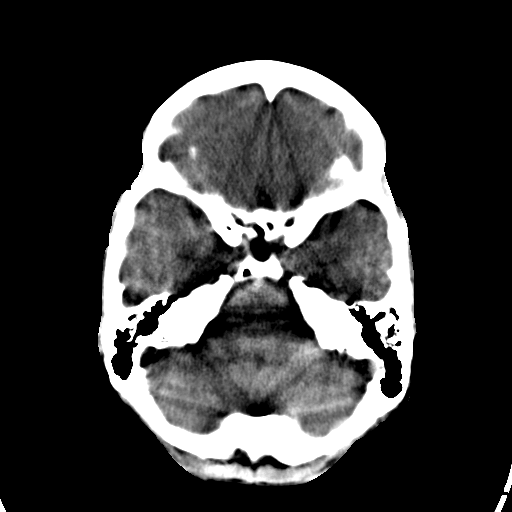
[im 7/27  brain]
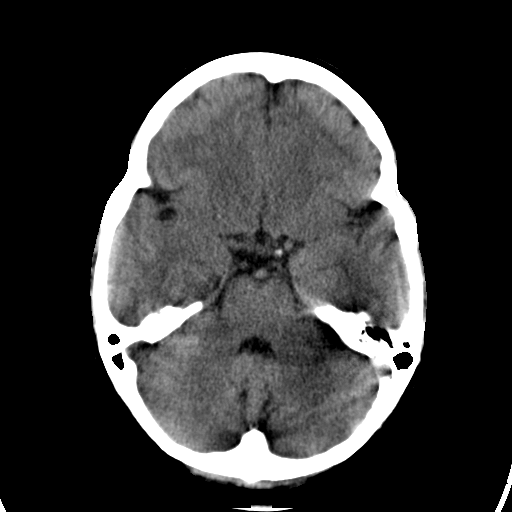
[im 9/27  brain]
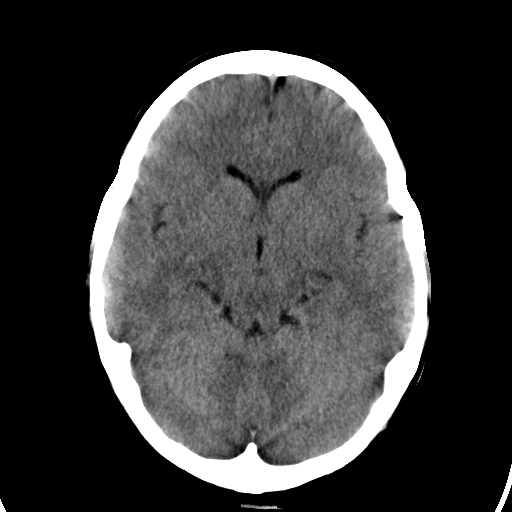
[im 9/27  bone]
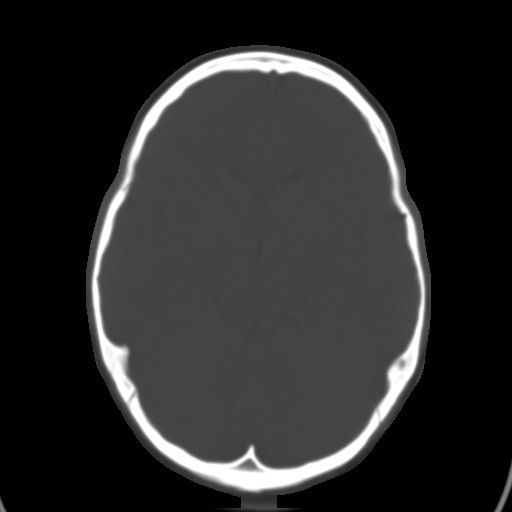
[im 10/27  brain]
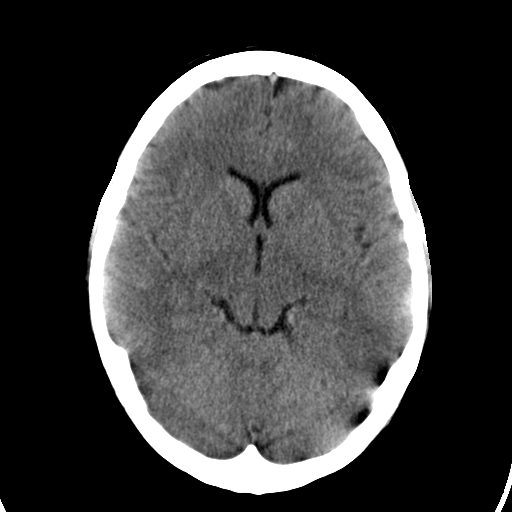
[im 12/27  brain]
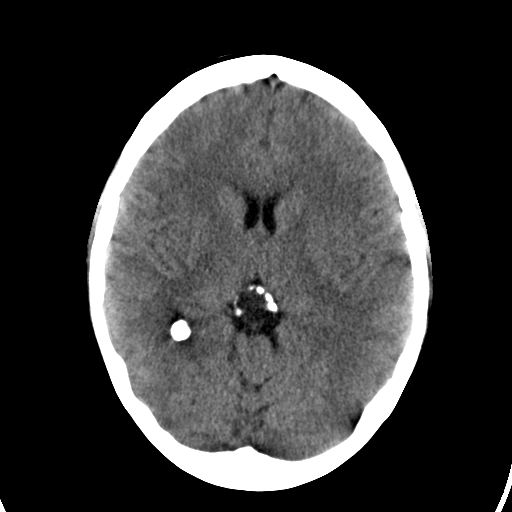
[im 13/27  brain]
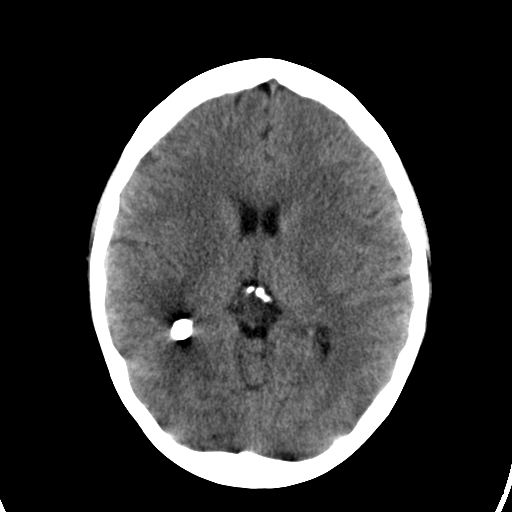
[im 15/27  brain]
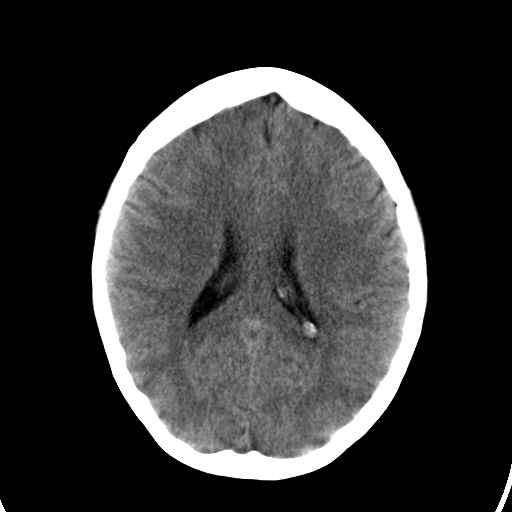
[im 15/27  bone]
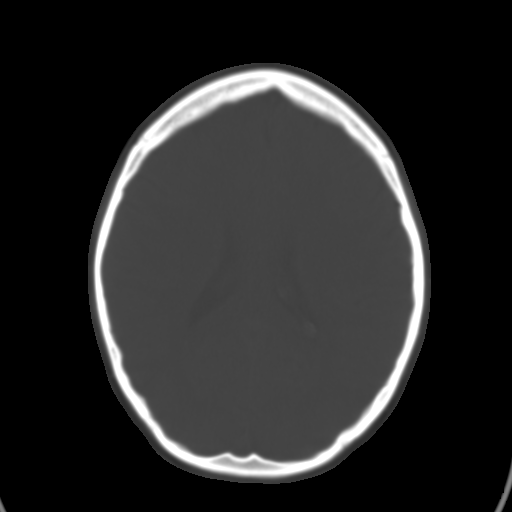
[im 16/27  brain]
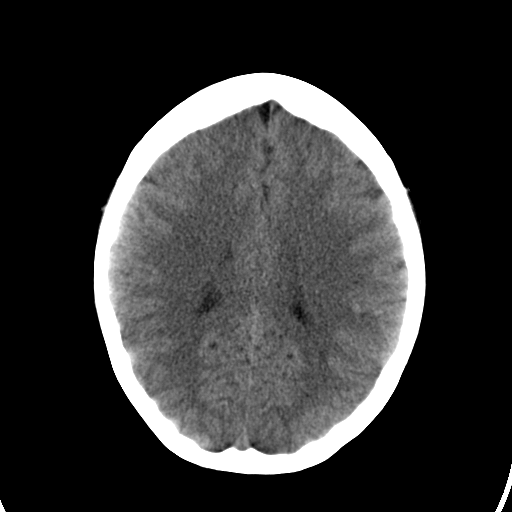
[im 18/27  brain]
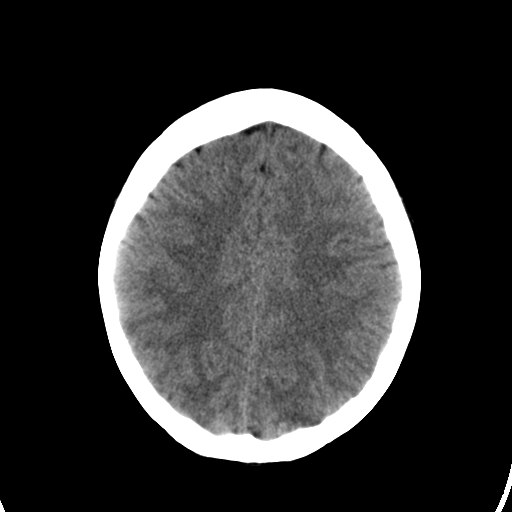
[im 19/27  brain]
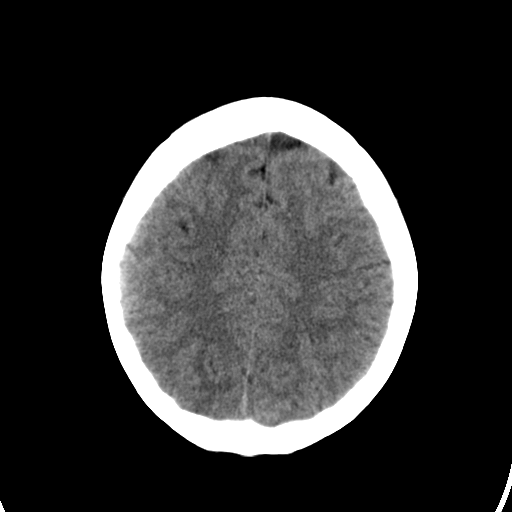
[im 21/27  brain]
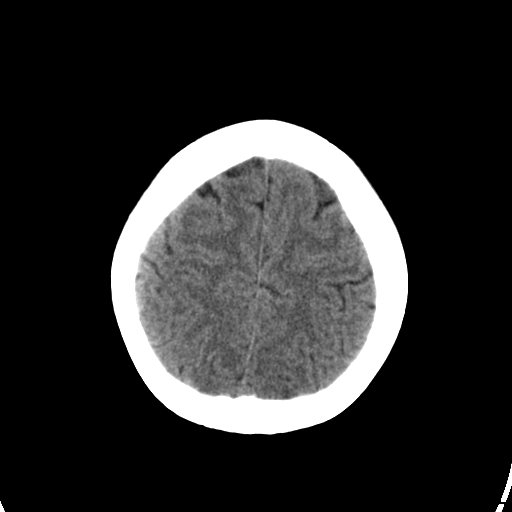
[im 21/27  bone]
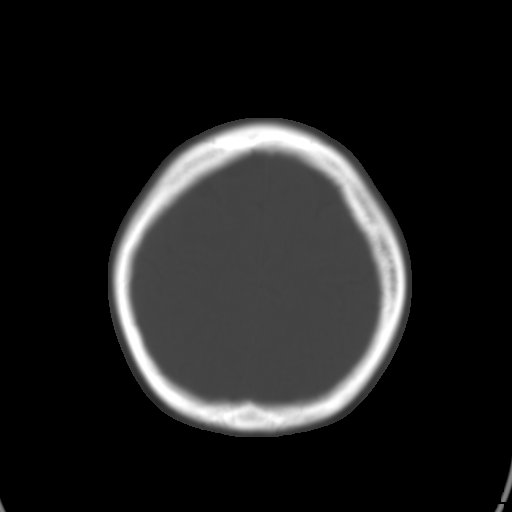
[im 23/27  brain]
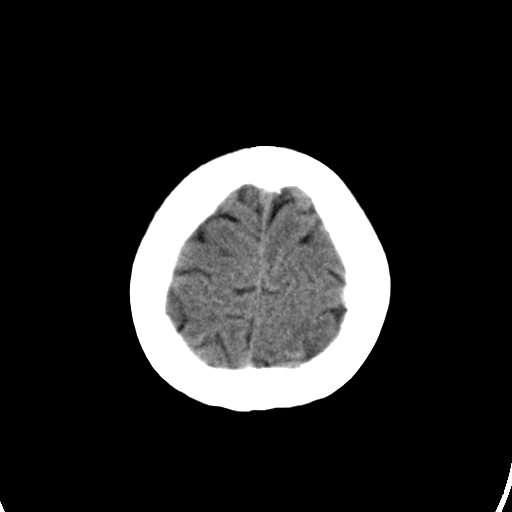
[im 24/27  brain]
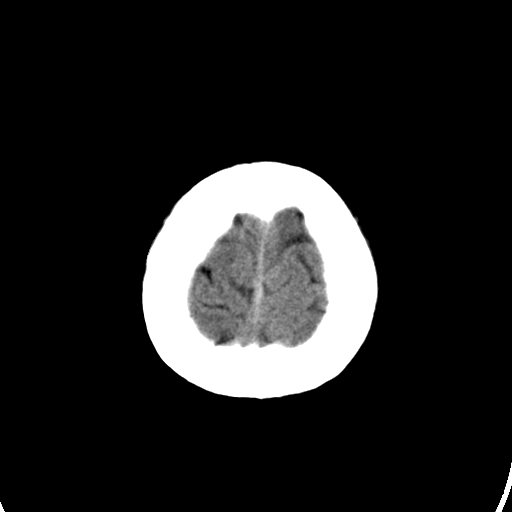
[im 26/27  brain]
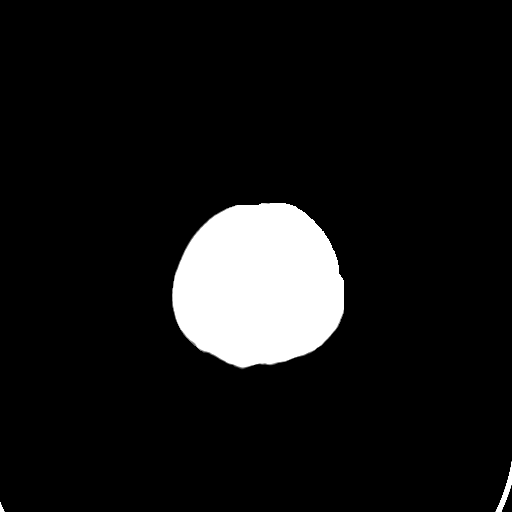

[16 of 27 positions shown; findings below may reference images not displayed]

FINDINGS: The ventricles are normal in size and configuration. There is no
intracranial mass, hemorrhage, extra-axial fluid collection, or
midline shift. Gray-white compartments are normal. There is no acute
infarct evident. Bony calvarium appears intact. The visualized
mastoid air cells are clear. Visualized orbits appear symmetric
bilaterally.
IMPRESSION: Study within normal limits.
